# Patient Record
Sex: Male | Born: 1990 | Race: White | Hispanic: No | Marital: Single | Smoking: Never smoker
Health system: Southern US, Community
[De-identification: ages and names within clinical notes are randomized; demographics above are authoritative.]

## PROBLEM LIST (undated history)

## (undated) DIAGNOSIS — R519 Headache, unspecified: Secondary | ICD-10-CM

## (undated) DIAGNOSIS — R51 Headache: Secondary | ICD-10-CM

## (undated) DIAGNOSIS — J310 Chronic rhinitis: Secondary | ICD-10-CM

## (undated) DIAGNOSIS — J343 Hypertrophy of nasal turbinates: Secondary | ICD-10-CM

## (undated) DIAGNOSIS — R6889 Other general symptoms and signs: Secondary | ICD-10-CM

## (undated) DIAGNOSIS — J45909 Unspecified asthma, uncomplicated: Secondary | ICD-10-CM

## (undated) DIAGNOSIS — J342 Deviated nasal septum: Secondary | ICD-10-CM

## (undated) DIAGNOSIS — R448 Other symptoms and signs involving general sensations and perceptions: Secondary | ICD-10-CM

## (undated) DIAGNOSIS — J329 Chronic sinusitis, unspecified: Secondary | ICD-10-CM

## (undated) HISTORY — PX: ANTERIOR CRUCIATE LIGAMENT REPAIR: SHX115

## (undated) HISTORY — PX: MYRINGOTOMY: SUR874

---

## 2009-08-20 ENCOUNTER — Ambulatory Visit: Payer: Self-pay | Admitting: Urology

## 2010-11-01 ENCOUNTER — Emergency Department: Payer: Self-pay | Admitting: Emergency Medicine

## 2012-10-04 IMAGING — CR DG KNEE COMPLETE 4+V*R*
1 series · 4 of 4 positions shown · non-contrast
Comparison: none

REASON FOR EXAM: injury/swelling
COMMENTS:   May transport without cardiac monitor

PROCEDURE:     DXR - DXR KNEE RT COMP WITH OBLIQUES  - November 01, 2010  [DATE]
RESULT:     Images of the right knee demonstrate no fracture, dislocation or
radiopaque foreign body.

[Series 1: view not recorded · 0.17mm/px · 4 of 4 slices shown]
[im 1/4]
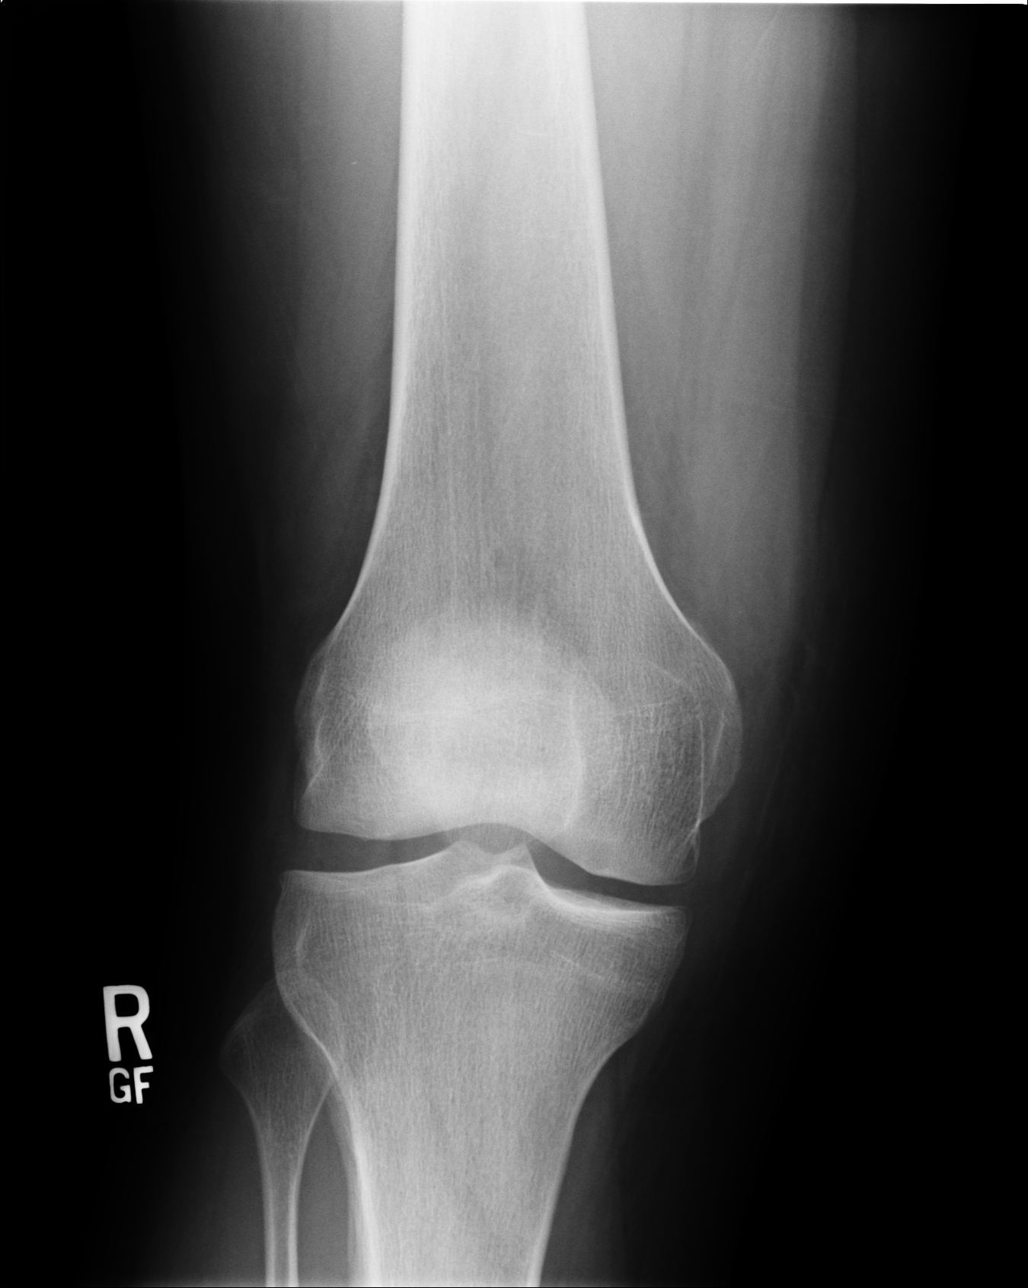
[im 2/4]
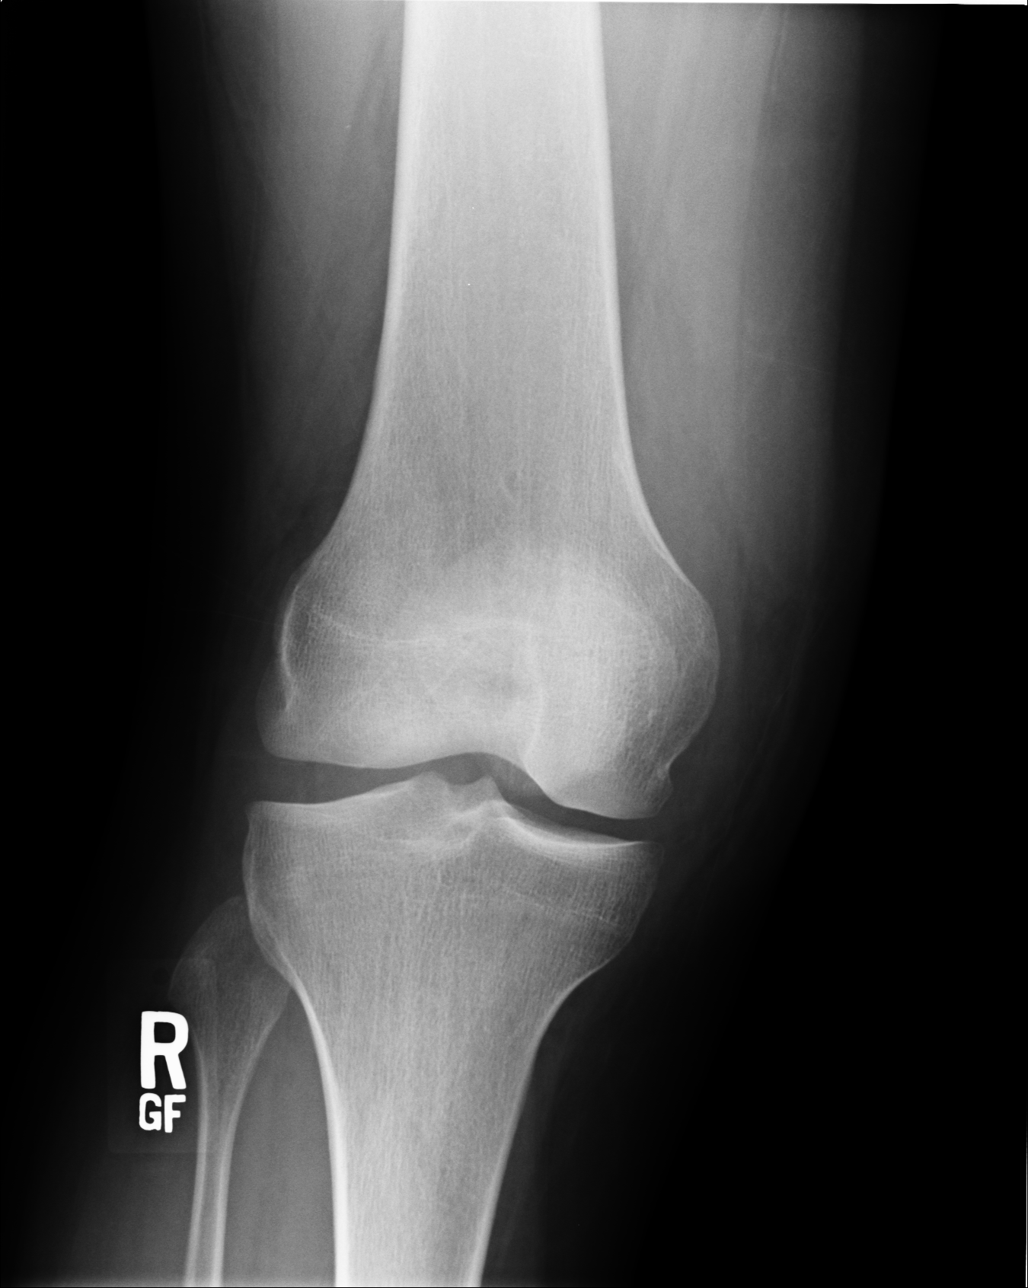
[im 3/4]
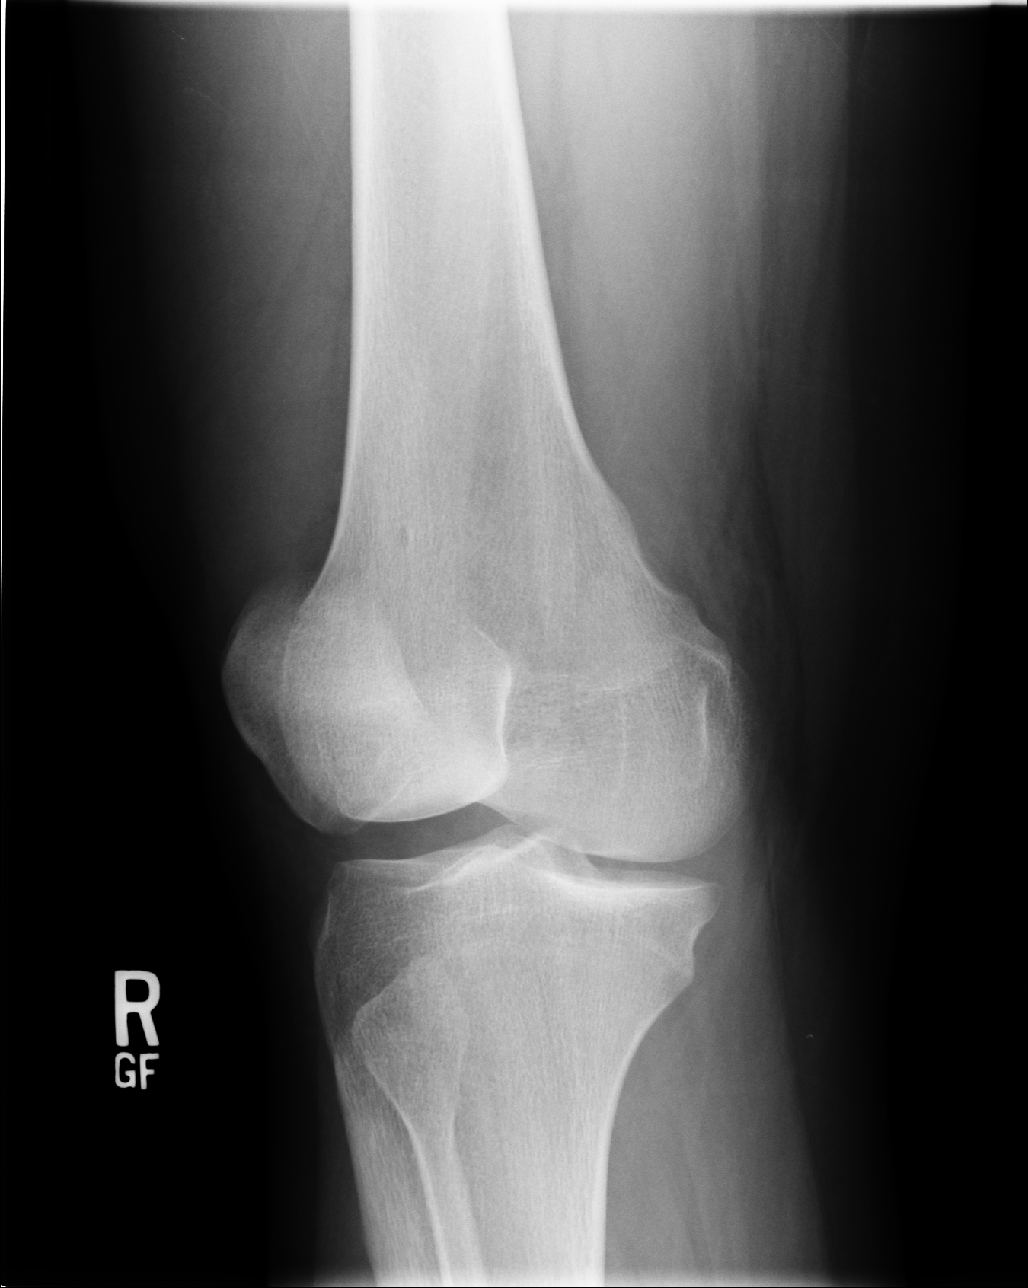
[im 4/4]
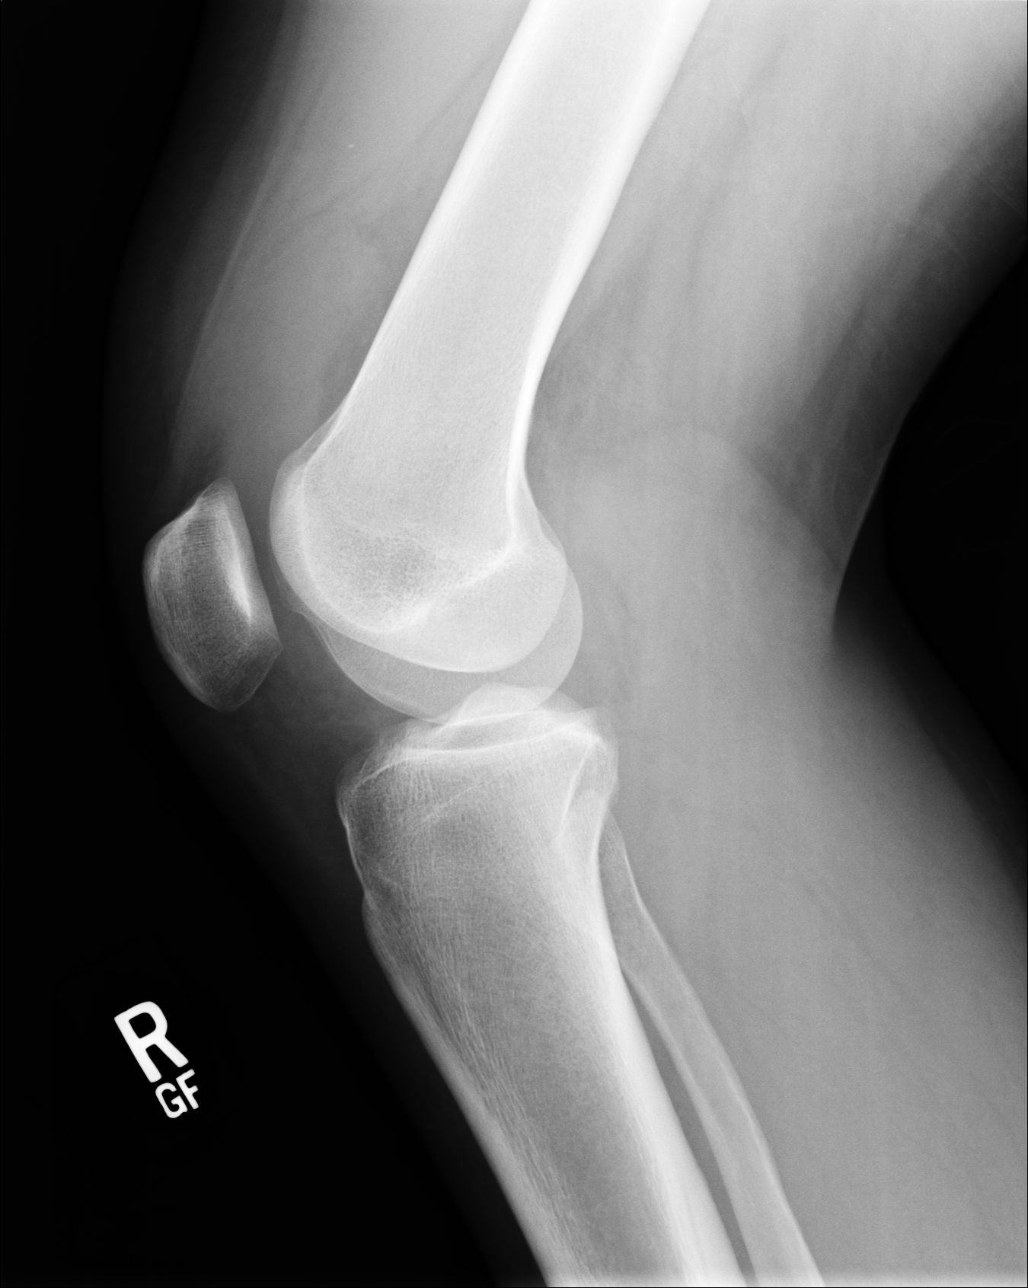

[4 of 4 positions shown; findings below may reference images not displayed]

IMPRESSION: Please see above.

## 2015-09-12 ENCOUNTER — Ambulatory Visit: Payer: Managed Care, Other (non HMO) | Admitting: Anesthesiology

## 2015-09-12 ENCOUNTER — Encounter
Admission: RE | Disposition: A | Discharge status: Home / Self Care | Payer: Self-pay | Source: Ambulatory Visit | Attending: Unknown Physician Specialty

## 2015-09-12 ENCOUNTER — Ambulatory Visit
Admission: RE | Admit: 2015-09-12 | Discharge: 2015-09-12 | Disposition: A | Discharge status: Home / Self Care | Payer: Managed Care, Other (non HMO) | Source: Ambulatory Visit | Attending: Unknown Physician Specialty | Admitting: Unknown Physician Specialty

## 2015-09-12 DIAGNOSIS — J342 Deviated nasal septum: Secondary | ICD-10-CM | POA: Insufficient documentation

## 2015-09-12 DIAGNOSIS — J343 Hypertrophy of nasal turbinates: Secondary | ICD-10-CM | POA: Insufficient documentation

## 2015-09-12 DIAGNOSIS — J3489 Other specified disorders of nose and nasal sinuses: Secondary | ICD-10-CM | POA: Insufficient documentation

## 2015-09-12 HISTORY — DX: Headache: R51

## 2015-09-12 HISTORY — PX: NASAL TURBINATE REDUCTION: SHX2072

## 2015-09-12 HISTORY — DX: Chronic sinusitis, unspecified: J32.9

## 2015-09-12 HISTORY — PX: SEPTOPLASTY: SHX2393

## 2015-09-12 HISTORY — DX: Headache, unspecified: R51.9

## 2015-09-12 HISTORY — DX: Other symptoms and signs involving general sensations and perceptions: R44.8

## 2015-09-12 HISTORY — DX: Hypertrophy of nasal turbinates: J34.3

## 2015-09-12 HISTORY — DX: Other general symptoms and signs: R68.89

## 2015-09-12 HISTORY — DX: Deviated nasal septum: J34.2

## 2015-09-12 HISTORY — DX: Unspecified asthma, uncomplicated: J45.909

## 2015-09-12 HISTORY — DX: Chronic rhinitis: J31.0

## 2015-09-12 SURGERY — SEPTOPLASTY, NOSE
Anesthesia: General | Site: Nose | Wound class: Clean Contaminated

## 2015-09-12 MED ORDER — HYDROCODONE-ACETAMINOPHEN 5-300 MG PO TABS: 1.0000 | ORAL_TABLET | ORAL | Status: AC | PRN

## 2015-09-12 MED ORDER — FENTANYL CITRATE (PF) 100 MCG/2ML IJ SOLN
INTRAMUSCULAR | Status: DC | PRN
  Administered 2015-09-12: 100 ug via INTRAVENOUS

## 2015-09-12 MED ORDER — LACTATED RINGERS IV SOLN
INTRAVENOUS | Status: DC
  Administered 2015-09-12: 11:00:00 via INTRAVENOUS

## 2015-09-12 MED ORDER — PHENYLEPHRINE HCL 0.5 % NA SOLN
NASAL | Status: DC | PRN
  Administered 2015-09-12: 30 mL via TOPICAL

## 2015-09-12 MED ORDER — GLYCOPYRROLATE 0.2 MG/ML IJ SOLN
INTRAMUSCULAR | Status: DC | PRN
  Administered 2015-09-12: 0.2 mg via INTRAVENOUS

## 2015-09-12 MED ORDER — FENTANYL CITRATE (PF) 100 MCG/2ML IJ SOLN
25.0000 ug | INTRAMUSCULAR | Status: AC | PRN
  Administered 2015-09-12: 50 ug via INTRAVENOUS
  Administered 2015-09-12: 25 ug via INTRAVENOUS
  Administered 2015-09-12: 50 ug via INTRAVENOUS
  Administered 2015-09-12: 25 ug via INTRAVENOUS

## 2015-09-12 MED ORDER — SCOPOLAMINE 1 MG/3DAYS TD PT72
1.0000 | MEDICATED_PATCH | Freq: Once | TRANSDERMAL | Status: DC
  Administered 2015-09-12: 1.5 mg via TRANSDERMAL

## 2015-09-12 MED ORDER — OXYCODONE HCL 5 MG PO TABS
5.0000 mg | ORAL_TABLET | Freq: Once | ORAL | Status: AC
  Administered 2015-09-12: 5 mg via ORAL

## 2015-09-12 MED ORDER — ACETAMINOPHEN 10 MG/ML IV SOLN
1000.0000 mg | Freq: Once | INTRAVENOUS | Status: AC
  Administered 2015-09-12: 1000 mg via INTRAVENOUS

## 2015-09-12 MED ORDER — PROPOFOL 10 MG/ML IV BOLUS
INTRAVENOUS | Status: DC | PRN
  Administered 2015-09-12: 50 mg via INTRAVENOUS
  Administered 2015-09-12: 200 mg via INTRAVENOUS

## 2015-09-12 MED ORDER — ONDANSETRON HCL 4 MG/2ML IJ SOLN: 4.0000 mg | Freq: Once | INTRAMUSCULAR | Status: DC | PRN

## 2015-09-12 MED ORDER — LIDOCAINE-EPINEPHRINE 1 %-1:100000 IJ SOLN
INTRAMUSCULAR | Status: DC | PRN
  Administered 2015-09-12: 10 mL

## 2015-09-12 MED ORDER — LIDOCAINE HCL (CARDIAC) 20 MG/ML IV SOLN
INTRAVENOUS | Status: DC | PRN
  Administered 2015-09-12: 40 mg via INTRAVENOUS

## 2015-09-12 MED ORDER — SULFAMETHOXAZOLE-TRIMETHOPRIM 400-80 MG PO TABS: 1.0000 | ORAL_TABLET | Freq: Two times a day (BID) | ORAL | Status: AC

## 2015-09-12 MED ORDER — MIDAZOLAM HCL 5 MG/5ML IJ SOLN
INTRAMUSCULAR | Status: DC | PRN
  Administered 2015-09-12: 2 mg via INTRAVENOUS

## 2015-09-12 MED ORDER — DEXAMETHASONE SODIUM PHOSPHATE 4 MG/ML IJ SOLN
INTRAMUSCULAR | Status: DC | PRN
  Administered 2015-09-12: 8 mg via INTRAVENOUS

## 2015-09-12 MED ORDER — ONDANSETRON HCL 4 MG/2ML IJ SOLN
INTRAMUSCULAR | Status: DC | PRN
  Administered 2015-09-12: 4 mg via INTRAVENOUS

## 2015-09-12 MED ORDER — ROCURONIUM BROMIDE 100 MG/10ML IV SOLN
INTRAVENOUS | Status: DC | PRN
  Administered 2015-09-12: 30 mg via INTRAVENOUS

## 2015-09-12 SURGICAL SUPPLY — 28 items
COAG SUCT 10F 3.5MM HAND CTRL (MISCELLANEOUS) ×4 IMPLANT
DRAPE HEAD BAR (DRAPES) ×4 IMPLANT
DRESSING NASL FOAM PST OP SINU (MISCELLANEOUS) IMPLANT
DRSG NASAL FOAM POST OP SINU (MISCELLANEOUS)
GLOVE BIO SURGEON STRL SZ7.5 (GLOVE) ×12 IMPLANT
HANDLE YANKAUER SUCT BULB TIP (MISCELLANEOUS) ×4 IMPLANT
KIT ROOM TURNOVER OR (KITS) ×4 IMPLANT
NEEDLE HYPO 25GX1X1/2 BEV (NEEDLE) ×4 IMPLANT
NS IRRIG 500ML POUR BTL (IV SOLUTION) ×4 IMPLANT
PACK DRAPE NASAL/ENT (PACKS) ×4 IMPLANT
PAD GROUND ADULT SPLIT (MISCELLANEOUS) ×4 IMPLANT
SOL ANTI-FOG 6CC FOG-OUT (MISCELLANEOUS) ×2 IMPLANT
SOL FOG-OUT ANTI-FOG 6CC (MISCELLANEOUS) ×2
SPLINT NASAL SEPTAL BLV .25 LG (MISCELLANEOUS) ×4 IMPLANT
SPLINT NASAL SEPTAL BLV .50 ST (MISCELLANEOUS) IMPLANT
SPONGE NEURO XRAY DETECT 1X3 (DISPOSABLE) ×4 IMPLANT
STRAP BODY AND KNEE 60X3 (MISCELLANEOUS) ×4 IMPLANT
SUT CHROMIC 3-0 (SUTURE) ×2
SUT CHROMIC 3-0 KS 27XMFL CR (SUTURE) ×2
SUT CHROMIC 5-0 (SUTURE)
SUT CHROMIC 5-0 P2 18XMFL CR (SUTURE)
SUT ETHILON 3-0 KS 30 BLK (SUTURE) ×4 IMPLANT
SUT PLAIN GUT 4-0 (SUTURE) IMPLANT
SUTURE CHRMC 3-0 KS 27XMFL CR (SUTURE) ×2 IMPLANT
SUTURE CHRMC 5-0 P2 18XMF CR (SUTURE) IMPLANT
SYRINGE 10CC LL (SYRINGE) ×4 IMPLANT
TOWEL OR 17X26 4PK STRL BLUE (TOWEL DISPOSABLE) ×4 IMPLANT
WATER STERILE IRR 500ML POUR (IV SOLUTION) ×4 IMPLANT

## 2015-09-12 NOTE — Discharge Instructions (Signed)
[facility] °[facility] °ENDOSCOPIC SINUS SURGERY °[facility] ° °What is Functional Endoscopic Sinus Surgery? ° The Surgery involves making the natural openings of the sinuses larger by removing the bony partitions that separate the sinuses from the nasal cavity.  The natural sinus lining is preserved as much as possible to allow the sinuses to resume normal function after the surgery.  In some patients nasal polyps (excessively swollen lining of the sinuses) may be removed to relieve obstruction of the sinus openings.  The surgery is performed through the nose using lighted scopes, which eliminates the need for incisions on the face.  A septoplasty is a different procedure which is sometimes performed with sinus surgery.  It involves straightening the boy partition that separates the two sides of your nose.  A crooked or deviated septum may need repair if is obstructing the sinuses or nasal airflow.  Turbinate reduction is also often performed during sinus surgery.  The turbinates are bony proturberances from the side walls of the nose which swell and can obstruct the nose in patients with sinus and allergy problems.  Their size can be surgically reduced to help relieve nasal obstruction. ° °What Can Sinus Surgery Do For Me? ° Sinus surgery can reduce the frequency of sinus infections requiring antibiotic treatment.  This can provide improvement in nasal congestion, post-nasal drainage, facial pressure and nasal obstruction.  Surgery will NOT prevent you from ever having an infection again, so it usually only for patients who get infections 4 or more times yearly requiring antibiotics, or for infections that do not clear with antibiotics.  It will not cure nasal allergies, so patients with allergies may still require medication to treat their allergies after surgery. Surgery may improve headaches related to sinusitis, however, some people will continue to  require medication to control sinus headaches related to allergies.  Surgery will do nothing for other forms of headache (migraine, tension or cluster). ° °What Are the Risks of Endoscopic Sinus Surgery? ° Current techniques allow surgery to be performed safely with little risk, however, there are rare complications that patients should be aware of.  Because the sinuses are located around the eyes, there is risk of eye injury, including blindness, though again, this would be quite rare. This is usually a result of bleeding behind the eye during surgery, which puts the vision oat risk, though there are treatments to protect the vision and prevent permanent disrupted by surgery causing a leak of the spinal fluid that surrounds the brain.  More serious complications would include bleeding inside the brain cavity or damage to the brain.  Again, all of these complications are uncommon, and spinal fluid leaks can be safely managed surgically if they occur.  The most common complication of sinus surgery is bleeding from the nose, which may require packing or cauterization of the nose.  Continued sinus have polyps may experience recurrence of the polyps requiring revision surgery.  Alterations of sense of smell or injury to the tear ducts are also rare complications.  ° °What is the Surgery Like, and what is the Recovery? ° The Surgery usually takes a couple of hours to perform, and is usually performed under a general anesthetic (completely asleep).  Patients are usually discharged home after a couple of hours.  Sometimes during surgery it is necessary to pack the nose to control bleeding, and the packing is left in place for 24 - 48 hours, and removed by your surgeon.    If a septoplasty was performed during the procedure, there is often a splint placed which must be removed after 5-7 days.   °Discomfort: Pain is usually mild to moderate, and can be controlled by prescription pain medication or acetaminophen (Tylenol).   Aspirin, Ibuprofen (Advil, Motrin), or Naprosyn (Aleve) should be avoided, as they can cause increased bleeding.  Most patients feel sinus pressure like they have a bad head cold for several days.  Sleeping with your head elevated can help reduce swelling and facial pressure, as can ice packs over the face.  A humidifier may be helpful to keep the mucous and blood from drying in the nose.  ° °Diet: There are no specific diet restrictions, however, you should generally start with clear liquids and a light diet of bland foods because the anesthetic can cause some nausea.  Advance your diet depending on how your stomach feels.  Taking your pain medication with food will often help reduce stomach upset which pain medications can cause. ° °Nasal Saline Irrigation: It is important to remove blood clots and dried mucous from the nose as it is healing.  This is done by having you irrigate the nose at least 3 - 4 times daily with a salt water solution.  We recommend using NeilMed Sinus Rinse (available at the drug store).  Fill the squeeze bottle with the solution, bend over a sink, and insert the tip of the squeeze bottle into the nose ½ of an inch.  Point the tip of the squeeze bottle towards the inside corner of the eye on the same side your irrigating.  Squeeze the bottle and gently irrigate the nose.  If you bend forward as you do this, most of the fluid will flow back out of the nose, instead of down your throat.   The solution should be warm, near body temperature, when you irrigate.   Each time you irrigate, you should use a full squeeze bottle.  ° °Note that if you are instructed to use Nasal Steroid Sprays at any time after your surgery, irrigate with saline BEFORE using the steroid spray, so you do not wash it all out of the nose. °Another product, Nasal Saline Gel (such as AYR Nasal Saline Gel) can be applied in each nostril 3 - 4 times daily to moisture the nose and reduce scabbing or crusting. ° °Bleeding:   Bloody drainage from the nose can be expected for several days, and patients are instructed to irrigate their nose frequently with salt water to help remove mucous and blood clots.  The drainage may be dark red or brown, though some fresh blood may be seen intermittently, especially after irrigation.  Do not blow you nose, as bleeding may occur. If you must sneeze, keep your mouth open to allow air to escape through your mouth. ° °If heavy bleeding occurs: Irrigate the nose with saline to rinse out clots, then spray the nose 3 - 4 times with Afrin Nasal Decongestant Spray.  The spray will constrict the blood vessels to slow bleeding.  Pinch the lower half of your nose shut to apply pressure, and lay down with your head elevated.  Ice packs over the nose may help as well. If bleeding persists despite these measures, you should notify your doctor.  Do not use the Afrin routinely to control nasal congestion after surgery, as it can result in worsening congestion and may affect healing.  ° ° ° °Activity: Return to work varies among patients. Most patients will be   out of work at least 5 - 7 days to recover.  Patient may return to work after they are off of narcotic pain medication, and feeling well enough to perform the functions of their job.  Patients must avoid heavy lifting (over 10 pounds) or strenuous physical for 2 weeks after surgery, so your employer may need to assign you to light duty, or keep you out of work longer if light duty is not possible.  NOTE: you should not drive, operate dangerous machinery, do any mentally demanding tasks or make any important legal or financial decisions while on narcotic pain medication and recovering from the general anesthetic.  °  °Call Your Doctor Immediately if You Have Any of the Following: °1. Bleeding that you cannot control with the above measures °2. Loss of vision, double vision, bulging of the eye or black eyes. °3. Fever over 101 degrees °4. Neck stiffness with  severe headache, fever, nausea and change in mental state. °You are always encourage to call anytime with concerns, however, please call with requests for pain medication refills during office hours. ° °Office Endoscopy: During follow-up visits your doctor will remove any packing or splints that may have been placed and evaluate and clean your sinuses endoscopically.  Topical anesthetic will be used to make this as comfortable as possible, though you may want to take your pain medication prior to the visit.  How often this will need to be done varies from patient to patient.  After complete recovery from the surgery, you may need follow-up endoscopy from time to time, particularly if there is concern of recurrent infection or nasal polyps. ° ° °General Anesthesia, Adult, Care After °Refer to this sheet in the next few weeks. These instructions provide you with information on caring for yourself after your procedure. Your health care provider may also give you more specific instructions. Your treatment has been planned according to current medical practices, but problems sometimes occur. Call your health care provider if you have any problems or questions after your procedure. °WHAT TO EXPECT AFTER THE PROCEDURE °After the procedure, it is typical to experience: °· Sleepiness. °· Nausea and vomiting. °HOME CARE INSTRUCTIONS °· For the first 24 hours after general anesthesia: °¨ Have a responsible person with you. °¨ Do not drive a car. If you are alone, do not take public transportation. °¨ Do not drink alcohol. °¨ Do not take medicine that has not been prescribed by your health care provider. °¨ Do not sign important papers or make important decisions. °¨ You may resume a normal diet and activities as directed by your health care provider. °· Change bandages (dressings) as directed. °· If you have questions or problems that seem related to general anesthesia, call the hospital and ask for the anesthetist or  anesthesiologist on call. °SEEK MEDICAL CARE IF: °· You have nausea and vomiting that continue the day after anesthesia. °· You develop a rash. °SEEK IMMEDIATE MEDICAL CARE IF:  °· You have difficulty breathing. °· You have chest pain. °· You have any allergic problems. °  °This information is not intended to replace advice given to you by your health care provider. Make sure you discuss any questions you have with your health care provider. °  °Document Released: 12/20/2000 Document Revised: 10/04/2014 Document Reviewed: 01/12/2012 °Elsevier Interactive Patient Education ©2016 Else[employer]. ° ° ° °Scopolamine skin patches °What is this medicine? °SCOPOLAMINE (skoe POL a meen) is used to prevent nausea and vomiting caused by motion sickness, anesthesia   and surgery. °This medicine may be used for other purposes; ask your health care provider or pharmacist if you have questions. °What should I tell my health care provider before I take this medicine? °They need to know if you have any of these conditions: °-glaucoma °-kidney or liver disease °-an unusual or allergic reaction (especially skin allergy) to scopolamine, atropine, other medicines, foods, dyes, or preservatives °-pregnant or trying to get pregnant °-breast-feeding °How should I use this medicine? °This medicine is for external use only. Follow the directions on the prescription label. One patch contains enough medicine to prevent motion sickness for up to 3 days. Apply the patch at least 4 hours before you need it and only wear one disc at a time. Choose an area behind the ear, that is clean, dry, hairless and free from any cuts or irritation. Wipe the area with a clean dry tissue. Peel off the plastic backing of the skin patch, trying not to touch the adhesive side with your hands. Do not cut the patches. Firmly apply to the area you have chosen, with the metallic side of the patch to the skin and the tan-colored side showing. Once firmly in place, wash  your hands well with soap and water. Remove the disc after 3 days, or sooner if you no longer need it. After removing the patch, wash your hands and the area behind your ear thoroughly with soap and water. The patch will still contain some medicine after use. To avoid accidental contact or ingestion by children or pets, fold the used patch in half with the sticky side together and throw away in the trash out of the reach of children and pets. If you need to use a second patch after you remove the first, place it behind the other ear. °Talk to your pediatrician regarding the use of this medicine in children. Special care may be needed. °Overdosage: If you think you have taken too much of this medicine contact a poison control center or emergency room at once. °NOTE: This medicine is only for you. Do not share this medicine with others. °What if I miss a dose? °Make sure you apply the patch at least 4 hours before you need it. You can apply it the night before traveling. °What may interact with this medicine? °-benztropine °-bethanechol °-medicines for anxiety or sleeping problems like diazepam or temazepam °-medicines for hay fever and other allergies °-medicines for mental depression °-muscle relaxants °This list may not describe all possible interactions. Give your health care provider a list of all the medicines, herbs, non-prescription drugs, or dietary supplements you use. Also tell them if you smoke, drink alcohol, or use illegal drugs. Some items may interact with your medicine. °What should I watch for while using this medicine? °Keep the patch dry, if possible, to prevent it from falling off. Limited contact with water, however, as in bathing or swimming, will not affect the system. If the patch falls off, throw it away and put a new one behind the other ear. °You may get drowsy or dizzy. Do not drive, use machinery, or do anything that needs mental alertness until you know how this medicine affects you. Do  not stand or sit up quickly, especially if you are an older patient. This reduces the risk of dizzy or fainting spells. Alcohol may interfere with the effect of this medicine. Avoid alcoholic drinks. °Your mouth may get dry. Chewing sugarless gum or sucking hard candy, and drinking plenty of water may help.   Contact your doctor if the problem does not go away or is severe. °This medicine may cause dry eyes and blurred vision. If you wear contact lenses you may feel some discomfort. Lubricating drops may help. See your eye doctor if the problem does not go away or is severe. °If you are going to have a magnetic resonance imaging (MRI) procedure, tell your MRI technician if you have this patch on your body. It must be removed before a MRI. °What side effects may I notice from receiving this medicine? °Side effects that you should report to your doctor or health care professional as soon as possible: °-agitation, nervousness, confusion °-blurred vision and other eye problems °-dizziness, drowsiness °-eye pain or redness in the whites of the eye °-hallucinations °-pain or difficulty passing urine °-skin rash, itching °-vomiting °Side effects that usually do not require medical attention (report to your doctor or health care professional if they continue or are bothersome): °-headache °-nausea °This list may not describe all possible side effects. Call your doctor for medical advice about side effects. You may report side effects to FDA at 1-800-FDA-1088. °Where should I keep my medicine? °Keep out of the reach of children. °Store at room temperature between 20 and 25 degrees C (68 and 77 degrees F). Throw away any unused medicine after the expiration date. When you remove a patch, fold it and throw it in the trash as described above. °NOTE: This sheet is a summary. It may not cover all possible information. If you have questions about this medicine, talk to your doctor, pharmacist, or health care provider. °  °© 2016,  Elsevier/Gold Standard. (2012-02-10 13:31:48) ° °

## 2015-09-12 NOTE — Anesthesia Procedure Notes (Signed)
Procedure Name: Intubation Date/Time: 09/12/2015 12:05 PM Performed by: Jimmy PicketAMYOT, James Lutz Pre-anesthesia Checklist: Patient identified, Emergency Drugs available, Suction available, Patient being monitored and Timeout performed Patient Re-evaluated:Patient Re-evaluated prior to inductionOxygen Delivery Method: Circle system utilized Preoxygenation: Pre-oxygenation with 100% oxygen Intubation Type: IV induction Ventilation: Mask ventilation without difficulty Laryngoscope Size: Miller and 3 Grade View: Grade I Tube type: Oral Rae Tube size: 7.0 mm Number of attempts: 2 Placement Confirmation: ETT inserted through vocal cords under direct vision,  positive ETCO2 and breath sounds checked- equal and bilateral Tube secured with: Tape Dental Injury: Teeth and Oropharynx as per pre-operative assessment  Comments: ETT inserted on 1st attempt and pt cough, dislocating ETT. Inserted without difficulty on 2nd attempt, secured and +/=BBS.

## 2015-09-12 NOTE — Transfer of Care (Signed)
Immediate Anesthesia Transfer of Care Note  Patient: James Lutz  Procedure(s) Performed: Procedure(s): SEPTOPLASTY (N/A) TURBINATE REDUCTION/SUBMUCOSAL RESECTION (Bilateral)  Patient Location: PACU  Anesthesia Type: General ETT  Level of Consciousness: awake, alert  and patient cooperative  Airway and Oxygen Therapy: Patient Spontanous Breathing and Patient connected to supplemental oxygen  Post-op Assessment: Post-op Vital signs reviewed, Patient's Cardiovascular Status Stable, Respiratory Function Stable, Patent Airway and No signs of Nausea or vomiting  Post-op Vital Signs: Reviewed and stable  Complications: No apparent anesthesia complications

## 2015-09-12 NOTE — Anesthesia Preprocedure Evaluation (Signed)
Anesthesia Evaluation  Patient identified by MRN, date of birth, ID band  Reviewed: Allergy & Precautions, H&P , NPO status , Patient's Chart, lab work & pertinent test results  Airway Mallampati: II  TM Distance: >3 FB Neck ROM: full    Dental no notable dental hx.    Pulmonary asthma ,    Pulmonary exam normal        Cardiovascular  Rhythm:regular Rate:Normal     Neuro/Psych  Headaches,    GI/Hepatic   Endo/Other    Renal/GU      Musculoskeletal   Abdominal   Peds  Hematology   Anesthesia Other Findings   Reproductive/Obstetrics                             Anesthesia Physical Anesthesia Plan  ASA: II  Anesthesia Plan: General ETT   Post-op Pain Management:    Induction:   Airway Management Planned:   Additional Equipment:   Intra-op Plan:   Post-operative Plan:   Informed Consent: I have reviewed the patients History and Physical, chart, labs and discussed the procedure including the risks, benefits and alternatives for the proposed anesthesia with the patient or authorized representative who has indicated his/her understanding and acceptance.     Plan Discussed with: CRNA  Anesthesia Plan Comments:         Anesthesia Quick Evaluation

## 2015-09-12 NOTE — Op Note (Signed)
PREOPERATIVE DIAGNOSIS:  Chronic nasal obstruction.  POSTOPERATIVE DIAGNOSIS:  Chronic nasal obstruction.  SURGEON:  Davina Pokehapman Lutz. Nation Cradle, M.D.  NAME OF PROCEDURE:  1. Nasal septoplasty. 2. Submucous resection of inferior turbinates.  OPERATIVE FINDINGS:  Severe nasal septal deformity, hypertrophy of the inferior turbinates.   DESCRIPTION OF THE PROCEDURE:  James Lutz was identified in the holding area and taken to the operating room and placed in the supine position.  After general endotracheal anesthesia was induced, the table was turned 45 degrees and the patient was placed in a semi-Fowler position.  The nose was then topically anesthetized with Lidocaine, cotton pledgets were placed within each nostril. After approximately 5 minutes, this was removed at which time a local anesthetic of 1% Lidocaine 1:100,000 units of Epinephrine was used to inject the inferior turbinates in the nasal septum. A total of 10 ml was used. Examination of the nose showed a severe right nasal septal deformity and tremendous hypertrophied inferior turbinate.  Beginning on the right hand side a hemitransfixion incision was then created on the leading edge of the septum on the right.  A subperichondrial plane was elevated posteriorly on the left and taken back to the perpendicular plate of the ethmoid where subperiosteal plane was elevated posteriorly on the left. A large septal spur was identified on the right hand side impacting on the inferior turbinate.  An inferior rim of cartilage was removed anteriorly with care taken to leave an anterior strut to prevent nasal collapse. With this strut removed the perpendicular plate of the ethmoid was separated from the quadrangular cartilage. The large septal spur was removed.  The septum was then replaced in the midline. Reinspection through each nostril showed excellent reduction of the septal deformity. A left posterior inferior fenestration was then created to allow hematoma  drainage.  With the septoplasty completed, beginning on the left-hand side, a 15 blade was used to incise along the inferior edge of the inferior turbinate. A superior laterally based flap was then elevated. The underlying conchal bone of mucosa was excised using Knight scissors. The flap was then laid back over the turbinate stump and cauterized using suction cautery. In a similar fashion the submucous resection was performed on the right.  With the submucous resection completed bilaterally and no active bleeding, the hemitransfixion incision was then closed using two interrupted 3-0 chromic sutures.  Plastic nasal septal splints were placed within each nostril and affixed to the septum using a 3-0 nylon suture. Stammberger was then used beneath each inferior turbinate for hemostasis.    The patient tolerated the procedure well, was returned to anesthesia, extubated in the operating room, and taken to the recovery room in stable condition.    CULTURES:  None.  SPECIMENS:  None.  ESTIMATED BLOOD LOSS:  25 cc.  James Lutz  09/12/2015  12:46 PM

## 2015-09-12 NOTE — Anesthesia Postprocedure Evaluation (Signed)
Anesthesia Post Note  Patient: James Lutz  Procedure(s) Performed: Procedure(s) (LRB): SEPTOPLASTY (N/A) TURBINATE REDUCTION/SUBMUCOSAL RESECTION (Bilateral)  Patient location during evaluation: PACU Anesthesia Type: General Level of consciousness: awake and alert and oriented Pain management: satisfactory to patient Vital Signs Assessment: post-procedure vital signs reviewed and stable Respiratory status: spontaneous breathing, nonlabored ventilation and respiratory function stable Cardiovascular status: blood pressure returned to baseline and stable Postop Assessment: Adequate PO intake and No signs of nausea or vomiting Anesthetic complications: no    Cherly BeachStella, Cyann Venti J

## 2015-09-12 NOTE — H&P (Signed)
  H+P  Reviewed and will be scanned in later. No changes noted. 

## 2015-09-15 ENCOUNTER — Encounter: Payer: Self-pay | Admitting: Unknown Physician Specialty

## 2016-08-17 ENCOUNTER — Telehealth: Payer: Self-pay | Admitting: Internal Medicine

## 2016-08-17 NOTE — Telephone Encounter (Signed)
Ok to set him up.

## 2016-08-17 NOTE — Telephone Encounter (Signed)
Pt dad is Dr. Butch Pennyavid [place] and wishes for his son to establish care with you. Please advise, thank you!  Call @ [phone] - ChiropodistLisa Office Manager

## 2016-08-17 NOTE — Telephone Encounter (Signed)
Please advise thanks.

## 2016-08-18 NOTE — Telephone Encounter (Signed)
Clydie BraunKaren, can you assist with getting him established at our practice with a new patient appt with Dr. Darrick Huntsmanullo, thanks

## 2016-08-18 NOTE — Telephone Encounter (Signed)
Pt has been scheduled for 10/01/16.

## 2016-08-24 ENCOUNTER — Encounter: Payer: Self-pay | Admitting: Internal Medicine

## 2016-08-24 ENCOUNTER — Other Ambulatory Visit (HOSPITAL_COMMUNITY)
Admission: RE | Admit: 2016-08-24 | Discharge: 2016-08-24 | Disposition: A | Discharge status: Home / Self Care | Payer: 59 | Source: Ambulatory Visit | Attending: Internal Medicine | Admitting: Internal Medicine

## 2016-08-24 ENCOUNTER — Ambulatory Visit (INDEPENDENT_AMBULATORY_CARE_PROVIDER_SITE_OTHER): Payer: 59 | Admitting: Internal Medicine

## 2016-08-24 VITALS — BP 118/68 | HR 66 | Temp 97.7°F | Resp 12 | Ht 75.0 in | Wt 238.2 lb

## 2016-08-24 DIAGNOSIS — Z113 Encounter for screening for infections with a predominantly sexual mode of transmission: Secondary | ICD-10-CM | POA: Insufficient documentation

## 2016-08-24 DIAGNOSIS — F411 Generalized anxiety disorder: Secondary | ICD-10-CM

## 2016-08-24 DIAGNOSIS — F4322 Adjustment disorder with anxiety: Secondary | ICD-10-CM

## 2016-08-24 DIAGNOSIS — R0683 Snoring: Secondary | ICD-10-CM

## 2016-08-24 DIAGNOSIS — G4719 Other hypersomnia: Secondary | ICD-10-CM

## 2016-08-24 DIAGNOSIS — Z23 Encounter for immunization: Secondary | ICD-10-CM

## 2016-08-24 DIAGNOSIS — R41840 Attention and concentration deficit: Secondary | ICD-10-CM

## 2016-08-24 DIAGNOSIS — M546 Pain in thoracic spine: Secondary | ICD-10-CM

## 2016-08-24 DIAGNOSIS — R5383 Other fatigue: Secondary | ICD-10-CM

## 2016-08-24 DIAGNOSIS — G8929 Other chronic pain: Secondary | ICD-10-CM

## 2016-08-24 DIAGNOSIS — M25511 Pain in right shoulder: Secondary | ICD-10-CM

## 2016-08-24 LAB — COMPREHENSIVE METABOLIC PANEL
ALT: 46 U/L (ref 0–53)
AST: 27 U/L (ref 0–37)
Albumin: 4.6 g/dL (ref 3.5–5.2)
Alkaline Phosphatase: 82 U/L (ref 39–117)
BILIRUBIN TOTAL: 0.8 mg/dL (ref 0.2–1.2)
BUN: 15 mg/dL (ref 6–23)
CO2: 31 meq/L (ref 19–32)
CREATININE: 1 mg/dL (ref 0.40–1.50)
Calcium: 9.8 mg/dL (ref 8.4–10.5)
Chloride: 101 mEq/L (ref 96–112)
GFR: 94.74 mL/min (ref >60.00)
GLUCOSE: 85 mg/dL (ref 70–99)
Potassium: 4.6 mEq/L (ref 3.5–5.1)
Sodium: 140 mEq/L (ref 135–145)
Total Protein: 7.5 g/dL (ref 6.0–8.3)

## 2016-08-24 LAB — TSH: TSH: 1.72 u[IU]/mL (ref 0.35–4.50)

## 2016-08-24 MED ORDER — MELOXICAM 15 MG PO TABS: 15.0000 mg | ORAL_TABLET | Freq: Every day | ORAL | 2 refills | Status: DC

## 2016-08-24 MED ORDER — SERTRALINE HCL 50 MG PO TABS: 50.0000 mg | ORAL_TABLET | Freq: Every day | ORAL | 3 refills | Status: DC

## 2016-08-24 NOTE — Progress Notes (Addendum)
Subjective:  Patient ID: James Lutz, male    DOB: 1991-04-11  Age: 25 y.o. MRN: [id number]  CC: The primary encounter diagnosis was Screen for STD (sexually transmitted disease). Diagnoses of Adjustment disorder with anxious mood, Fatigue, unspecified type, Chronic right-sided thoracic back pain, Encounter for immunization, Snoring, Daytime hypersomnolence, Concentration deficit, Anxiety state, and Pain in joint of right shoulder were also pertinent to this visit.  HPI James Lutz presents for establishment of care. Referred by father James Lutz.  Has not had a PCP since James Lutz in college.    Wants flu vaccine.  SEASONAL ALLERGIES,  Uses claritin ." My breathing has gotten worse"  Treated for asthma by James Lutz while in college but not since.  Using al albuterol inhaler as recently as a week ago.  .  Works in [employer] career change fro Forensic scientistchemistry to finance.  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 Studying habits difficult and problematic.  Was prescribed  Vyvanse while in college by James Lutz for symptoms of poor concentration,   Slow test taking, etc.  ddid not  have trouble in hight school.   But no formal evaluation.  Discussed referral bc having trouble focusing  .  Having trouble disciplining himself., going to be on time.     Living situation now alone ,  Roommate moved out,   No romantic relationship s currently,  Back to back breakups,  First one after a year,  Second one was more short llived ended  one month ago.   Has tried cutting out social media on phone for October which helped, spends  Time on the dating sites daily Difficulty getting out of bed even with setting 3 alarms.  Snores,  Wakes up tired falls asleep during the day if studying.   Overindulges in alcohol every weekend.  Chronic Upper back pain feels better after "poppig" back but returns after an hour or two.  Marland Kitchen.  Has worked at a Runner, broadcasting/film/videolab bench for the past several years.   since July ,  Started  when working at [employer]. Middle of shoulder blades and radiates to chest       Feels nervous after staying in one place too long,  Nervous playing golf.    History James Lutz has a past medical history of Asthma; Deviated septum; Facial pressure; Headache; Nasal turbinate hypertrophy; Rhinitis; and Sinusitis.   He has a past surgical history that includes Myringotomy; Anterior cruciate ligament repair (Left); Septoplasty (N/A, 09/12/2015); and Nasal turbinate reduction (Bilateral, 09/12/2015).   His family history is not on file.He reports that he has never smoked. He has never used smokeless tobacco. He reports that he drinks about 8.4 oz of alcohol per week . He reports that he uses drugs, including Marijuana.  Outpatient Medications Prior to Visit  Medication Sig Dispense Refill  . Hydrocodone-Acetaminophen 5-300 MG TABS Take 1-2 tablets by mouth every 4 (four) hours as needed. (Patient not taking: Reported on 08/24/2016) 40 each 0  . loratadine (CLARITIN) 10 MG tablet Take 10 mg by mouth daily. AM    . sulfamethoxazole-trimethoprim (BACTRIM) 400-80 MG tablet Take 1 tablet by mouth 2 (two) times daily. (Patient not taking: Reported on 08/24/2016) 30 tablet 0   No facility-administered medications prior to visit.     Review of Systems:  Patient denies headache, fevers, malaise, unintentional weight loss, skin rash, eye pain, sinus congestion and sinus pain, sore throat, dysphagia,  hemoptysis , cough, dyspnea, wheezing, chest pain, palpitations, orthopnea,  edema, abdominal pain, nausea, melena, diarrhea, constipation, flank pain, dysuria, hematuria, urinary  Frequency, nocturia, numbness, tingling, seizures,  Focal weakness, Loss of consciousness,  Tremor, insomnia, depression,, and suicidal ideation.     Objective:   BP 118/68   Pulse 66   Temp 97.7 F (36.5 C) (Oral)   Resp 12   Ht 6\' 3"  (1.905 m)   Wt 238 lb 4 oz (108.1 kg)   SpO2 99%   BMI 29.78 kg/m    Physical Exam:  General  appearance: alert, cooperative and appears stated age Head: Normocephalic, without obvious abnormality, atraumatic Eyes: conjunctivae/corneas clear. PERRL, EOM's intact. Fundi benign. Ears: normal TM's and external ear canals both ears Nose: Nares normal. Septum midline. Mucosa normal. No drainage or sinus tenderness. Throat: lips, mucosa, and tongue normal; teeth and gums normal Neck: no adenopathy, no carotid bruit, no JVD, supple, symmetrical, trachea midline and thyroid not enlarged, symmetric, no tenderness/mass/nodules Lungs: clear to auscultation bilaterally Breasts: normal appearance, no masses or tenderness Heart: regular rate and rhythm, S1, S2 normal, no murmur, click, rub or gallop Abdomen: soft, non-tender; bowel sounds normal; no masses,  no organomegaly Extremities: extremities normal, atraumatic, no cyanosis or edema Pulses: 2+ and symmetric Skin: Skin color, texture, turgor normal. No rashes or lesions Neurologic: Alert and oriented X 3, normal strength and tone. Normal symmetric reflexes. Normal coordination and gait.     Assessment & Plan:   Problem List Items Addressed This Visit    Anxiety state    Trial of sertraline for social anxiety      Concentration deficit    He was treated as a teenager but has not had a formal evaluation. Will refer to James Lutz and treat if indicated.given his history of snoring , daytime somnolence and early morning fatigue will also need to rule out sleep apnea.       Relevant Orders   Ambulatory referral to Psychology   Pain in joint, shoulder region    Trial of meloxicam.  Ergonomic assessment of workstation advised       Other Visit Diagnoses    Screen for STD (sexually transmitted disease)    -  Primary   Relevant Orders   HIV antibody (Completed)   Hepatitis C antibody (Completed)   GC/Chlamydia probe amp ([place] at [facility] (Completed)   Adjustment disorder with anxious mood       Fatigue, unspecified type        Relevant Orders   Comprehensive metabolic panel (Completed)   TSH (Completed)   Chronic right-sided thoracic back pain       Encounter for immunization       Relevant Orders   Flu Vaccine QUAD 36+ mos IM (Completed)   Snoring       Relevant Orders   Ambulatory referral to Sleep Studies   Daytime hypersomnolence       Relevant Orders   Ambulatory referral to Sleep Studies      I am having Mr. Gwen PoundsKowalski maintain his loratadine, Hydrocodone-Acetaminophen, sulfamethoxazole-trimethoprim, and levocetirizine.  Meds ordered this encounter  Medications  . levocetirizine (XYZAL) 5 MG tablet    Sig: Take 5 mg by mouth every evening.  Marland Kitchen. DISCONTD: meloxicam (MOBIC) 15 MG tablet    Sig: Take 1 tablet (15 mg total) by mouth daily.    Dispense:  30 tablet    Refill:  2  . DISCONTD: sertraline (ZOLOFT) 50 MG tablet    Sig: Take 1 tablet (50 mg total) by mouth daily.  Dispense:  30 tablet    Refill:  3    There are no discontinued medications.  Follow-up: No Follow-up on file.   Sherlene Shams, MD

## 2016-08-24 NOTE — Progress Notes (Signed)
Dictation #1 [id number]  [id number] Pre-visit discussion using our clinic review tool. No additional management support is needed unless otherwise documented below in the visit note.

## 2016-08-24 NOTE — Patient Instructions (Signed)
Meloxicam  15 mg daily  for right shoulder pain .  Do NOT TAKE MOTRIN OR ALEVE WIT THIS.  Ok to take tylenol  REDUCE ALCOHOL TO 2 DRINKS PER NIGHT   Get your facility to perform an ergonomic assessment of your work station  Referral to Dr Reggy EyeAltabet for ADD  Sertraline for anxiety.  Please start the medication at  1/2 tablet daily in the evening for the first few days to avoid nausea.  You can increase to a full tablet after 4 days if you havenot developed side effects of nausea.  RTC 3 months

## 2016-08-25 DIAGNOSIS — F411 Generalized anxiety disorder: Secondary | ICD-10-CM | POA: Insufficient documentation

## 2016-08-25 DIAGNOSIS — R41840 Attention and concentration deficit: Secondary | ICD-10-CM | POA: Insufficient documentation

## 2016-08-25 DIAGNOSIS — M25519 Pain in unspecified shoulder: Secondary | ICD-10-CM | POA: Insufficient documentation

## 2016-08-25 LAB — HIV ANTIBODY (ROUTINE TESTING W REFLEX): HIV: NONREACTIVE

## 2016-08-25 LAB — HEPATITIS C ANTIBODY: HCV Ab: NEGATIVE

## 2016-08-25 LAB — GC/CHLAMYDIA PROBE AMP (~~LOC~~) NOT AT ARMC
Chlamydia: NEGATIVE
Neisseria Gonorrhea: NEGATIVE

## 2016-08-25 NOTE — Assessment & Plan Note (Signed)
Trial of sertraline for social anxiety

## 2016-08-25 NOTE — Assessment & Plan Note (Signed)
He was treated as a teenager but has not had a formal evaluation. Will refer to Dr Reggy EyeAltabet and treat if indicated.given his history of snoring , daytime somnolence and early morning fatigue will also need to rule out sleep apnea.

## 2016-08-25 NOTE — Assessment & Plan Note (Signed)
Trial of meloxicam.  Ergonomic assessment of workstation advised

## 2016-08-26 ENCOUNTER — Encounter: Payer: Self-pay | Admitting: *Deleted

## 2016-08-26 NOTE — Progress Notes (Signed)
Letter mailed

## 2016-10-01 ENCOUNTER — Ambulatory Visit: Payer: Managed Care, Other (non HMO) | Admitting: Internal Medicine

## 2016-10-21 ENCOUNTER — Encounter: Payer: Self-pay | Admitting: Internal Medicine

## 2016-10-26 ENCOUNTER — Telehealth: Payer: Self-pay | Admitting: Internal Medicine

## 2016-10-26 NOTE — Telephone Encounter (Signed)
[employer] rep came in and drop off form for Dr Darrick Huntsmanullo to sign and fax to [phone]. Thank you! Form is in color folder.

## 2016-10-26 NOTE — Telephone Encounter (Signed)
Sleep Therapy Forms in Red folder to be filled out

## 2016-10-27 NOTE — Telephone Encounter (Signed)
Signed today.  Please notify patient this his sleep study showed mild sleep apnea , and an auto titrating CPAP machine is being ordered for him

## 2016-10-27 NOTE — Telephone Encounter (Signed)
Patient notified and Paperwork was faxed over to [facility]

## 2016-11-08 ENCOUNTER — Ambulatory Visit (INDEPENDENT_AMBULATORY_CARE_PROVIDER_SITE_OTHER): Payer: 59 | Admitting: Psychology

## 2016-11-08 DIAGNOSIS — F419 Anxiety disorder, unspecified: Secondary | ICD-10-CM | POA: Diagnosis not present

## 2016-11-08 DIAGNOSIS — F4323 Adjustment disorder with mixed anxiety and depressed mood: Secondary | ICD-10-CM

## 2016-11-23 ENCOUNTER — Ambulatory Visit: Payer: 59 | Admitting: Internal Medicine

## 2016-12-01 ENCOUNTER — Other Ambulatory Visit: Payer: Self-pay | Admitting: Internal Medicine

## 2016-12-01 DIAGNOSIS — G8929 Other chronic pain: Secondary | ICD-10-CM

## 2016-12-01 DIAGNOSIS — M546 Pain in thoracic spine: Principal | ICD-10-CM

## 2016-12-29 ENCOUNTER — Other Ambulatory Visit: Payer: Self-pay | Admitting: Internal Medicine

## 2016-12-29 DIAGNOSIS — F4322 Adjustment disorder with anxiety: Secondary | ICD-10-CM

## 2016-12-30 ENCOUNTER — Telehealth: Payer: Self-pay | Admitting: Internal Medicine

## 2016-12-30 DIAGNOSIS — F4322 Adjustment disorder with anxiety: Secondary | ICD-10-CM

## 2016-12-30 MED ORDER — SERTRALINE HCL 50 MG PO TABS: 50.0000 mg | ORAL_TABLET | Freq: Every day | ORAL | 5 refills | Status: DC

## 2016-12-30 NOTE — Telephone Encounter (Signed)
Spoke with pt and he stated that he was going to hold off on the appt for now because he has moved to [place]. He did state that he was still taking the Zoloft and seemed to doing better. He also stated that he is doing better with his sleep apnea, he not having to take the meloxicam because he is seeing a chiropractor, and he is doing better with his diet and exercise. Per Dr. Darrick Huntsman the zoloft was refilled for 6 months.

## 2016-12-30 NOTE — Telephone Encounter (Signed)
His neurocognitve testing was NEGATIVE for ADD .  He does not need vyvanse .he needs to manage his mild sleep apnea, increase his exercise, and consider alternative medication for depression/anxiety such as wellbutrin instead of zoloft.  pleas offer appt to discuss more

## 2017-07-13 ENCOUNTER — Telehealth: Payer: Self-pay | Admitting: Internal Medicine

## 2017-07-13 ENCOUNTER — Other Ambulatory Visit: Payer: Self-pay | Admitting: Internal Medicine

## 2017-07-13 DIAGNOSIS — F4322 Adjustment disorder with anxiety: Secondary | ICD-10-CM

## 2017-07-13 MED ORDER — SERTRALINE HCL 100 MG PO TABS: 100.0000 mg | ORAL_TABLET | Freq: Every day | ORAL | 0 refills | Status: AC

## 2017-07-13 NOTE — Telephone Encounter (Signed)
Does pt need to schedule an appt?

## 2017-07-13 NOTE — Telephone Encounter (Signed)
REMIND PATIENT HE IS 5 MONTHS OVERDUE FOR FOLLOW UP. DOSE INCREASED TO 100 MG ,  30 DAYS ONLY,.  HAS TO BE SEEN.  CALL IN RX TOO MANY CHOICES OF PHARMACIES  IN CHART

## 2017-07-13 NOTE — Telephone Encounter (Signed)
Pt was informed of the message below. Pt states that he will call back to schedule as he just moved.

## 2017-07-13 NOTE — Telephone Encounter (Signed)
Pt called and wanted to know if there was any way that Dr. Darrick Huntsmanullo could increase his dosage of sertraline (ZOLOFT) 50 MG tablet. He states that he started a new career and his anxiety is more now. Please advise, thank you!  Call pt @ [phone]

## 2017-12-24 ENCOUNTER — Other Ambulatory Visit: Payer: Self-pay | Admitting: Internal Medicine

## 2017-12-24 DIAGNOSIS — F4322 Adjustment disorder with anxiety: Secondary | ICD-10-CM

## 2017-12-26 NOTE — Telephone Encounter (Signed)
No labs or OV since 11/17?

## 2018-01-16 ENCOUNTER — Telehealth: Payer: Self-pay | Admitting: Internal Medicine

## 2018-01-16 NOTE — Telephone Encounter (Signed)
Copied from CRM [phone]. Topic: Quick Communication - Rx Refill/Question >> Jan 16, 2018  4:27 PM Everardo [place], [place], [place] wrote: Medication: Sertraline 100 mg Has the patient contacted their pharmacy? Yes Patient calling because he needs a refill on the above medication. Stated that he has been in contact with his pharmacy but was told [facility] Preferred Pharmacy (with phone number or street name): [facility] [address] [phone] Agent: Please be advised that RX refills may take up to 3 business days. We ask that you follow-up with your pharmacy.

## 2018-01-17 NOTE — Telephone Encounter (Signed)
Called pt.'s number. Unable to leave a message because the mail box is full. Left message at pharmacy that pt. Needs to schedule an appointment for any further refills.

## 2018-01-19 NOTE — Telephone Encounter (Signed)
[place].  He is asking if office can refill one mor retime to allow him time to get established with another pcp.  He states it is effecting anxiety at work    [facility] [id number] [facility] [place], [place] [facility] [address] [facility] [phone] (Phone) [phone] (Fax)

## 2018-01-19 NOTE — Telephone Encounter (Signed)
Patient was last seen in Nov 2017 [place] so there is no scheduled appointment. This medication was last filled on 07-13-17. Please advise.

## 2018-01-20 NOTE — Telephone Encounter (Signed)
FYI- I received a message from the online portal where the patient was requesting a refill on his Zoloft.  I called patient who answered and advised we have tried to contact him to notify that he need an appt before getting any addt'l refills.  Patient acknowledged understanding and advised [place] is now living in [place] and will be looking for a new PCP down there.  Patient aware we will not do any addt'l refills.  Removing Dr. Darrick Huntsmanullo as his PCP since he has moved.

## 2018-04-22 ENCOUNTER — Other Ambulatory Visit: Payer: Self-pay | Admitting: Internal Medicine

## 2018-04-22 DIAGNOSIS — F4322 Adjustment disorder with anxiety: Secondary | ICD-10-CM

## 2018-04-24 NOTE — Telephone Encounter (Signed)
Refilled: 07/13/2017 Last OV: 08/24/2016 Next OV: not scheduled

## 2022-12-01 ENCOUNTER — Other Ambulatory Visit: Payer: Self-pay

## 2022-12-01 MED ORDER — GARDASIL 9 0.5 ML IM SUSY: 0.5000 mL | PREFILLED_SYRINGE | INTRAMUSCULAR | 2 refills | Status: AC
